# Patient Record
Sex: Female | Born: 1960 | Hispanic: No | Marital: Single | Smoking: Former smoker
Health system: Southern US, Community
[De-identification: ages and names within clinical notes are randomized; demographics above are authoritative.]

## PROBLEM LIST (undated history)

## (undated) DIAGNOSIS — Z720 Tobacco use: Secondary | ICD-10-CM

## (undated) DIAGNOSIS — M069 Rheumatoid arthritis, unspecified: Secondary | ICD-10-CM

## (undated) DIAGNOSIS — I729 Aneurysm of unspecified site: Secondary | ICD-10-CM

## (undated) DIAGNOSIS — E039 Hypothyroidism, unspecified: Secondary | ICD-10-CM

## (undated) DIAGNOSIS — J45909 Unspecified asthma, uncomplicated: Secondary | ICD-10-CM

## (undated) DIAGNOSIS — J449 Chronic obstructive pulmonary disease, unspecified: Secondary | ICD-10-CM

## (undated) HISTORY — DX: Chronic obstructive pulmonary disease, unspecified: J44.9

## (undated) HISTORY — PX: VAGINAL HYSTERECTOMY: SHX2639

## (undated) HISTORY — PX: KNEE ARTHROSCOPY: SHX127

## (undated) HISTORY — PX: GALLBLADDER SURGERY: SHX652

## (undated) HISTORY — DX: Unspecified asthma, uncomplicated: J45.909

## (undated) HISTORY — DX: Rheumatoid arthritis, unspecified: M06.9

## (undated) HISTORY — PX: CEREBRAL ANEURYSM REPAIR: SHX164

## (undated) HISTORY — DX: Tobacco use: Z72.0

## (undated) HISTORY — PX: CARPAL TUNNEL RELEASE: SHX101

## (undated) HISTORY — DX: Hypothyroidism, unspecified: E03.9

## (undated) HISTORY — PX: OTHER SURGICAL HISTORY: SHX169

## (undated) HISTORY — DX: Aneurysm of unspecified site: I72.9

---

## 2005-05-23 ENCOUNTER — Emergency Department: Payer: Self-pay | Admitting: Emergency Medicine
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
## 2019-04-21 ENCOUNTER — Ambulatory Visit: Payer: BC Managed Care – PPO | Admitting: Allergy and Immunology

## 2019-05-02 ENCOUNTER — Ambulatory Visit: Payer: BC Managed Care – PPO | Admitting: Allergy and Immunology

## 2019-05-04 ENCOUNTER — Ambulatory Visit (INDEPENDENT_AMBULATORY_CARE_PROVIDER_SITE_OTHER): Payer: BC Managed Care – PPO | Admitting: Allergy and Immunology

## 2019-05-04 ENCOUNTER — Other Ambulatory Visit: Payer: Self-pay

## 2019-05-04 ENCOUNTER — Encounter: Payer: Self-pay | Admitting: Allergy and Immunology

## 2019-05-04 VITALS — BP 118/74 | HR 78 | Temp 98.2°F | Resp 18 | Ht 63.8 in | Wt 208.8 lb

## 2019-05-04 DIAGNOSIS — H1010 Acute atopic conjunctivitis, unspecified eye: Secondary | ICD-10-CM

## 2019-05-04 DIAGNOSIS — J3089 Other allergic rhinitis: Secondary | ICD-10-CM | POA: Diagnosis not present

## 2019-05-04 DIAGNOSIS — T63441D Toxic effect of venom of bees, accidental (unintentional), subsequent encounter: Secondary | ICD-10-CM

## 2019-05-04 DIAGNOSIS — J4540 Moderate persistent asthma, uncomplicated: Secondary | ICD-10-CM | POA: Diagnosis not present

## 2019-05-04 DIAGNOSIS — J301 Allergic rhinitis due to pollen: Secondary | ICD-10-CM | POA: Diagnosis not present

## 2019-05-04 DIAGNOSIS — F17210 Nicotine dependence, cigarettes, uncomplicated: Secondary | ICD-10-CM

## 2019-05-04 DIAGNOSIS — K219 Gastro-esophageal reflux disease without esophagitis: Secondary | ICD-10-CM

## 2019-05-04 MED ORDER — MONTELUKAST SODIUM 10 MG PO TABS: 10.0000 mg | ORAL_TABLET | Freq: Every day | ORAL | 5 refills | Status: DC

## 2019-05-04 MED ORDER — TRELEGY ELLIPTA 100-62.5-25 MCG/INH IN AEPB: 1.0000 | INHALATION_SPRAY | Freq: Every day | RESPIRATORY_TRACT | 5 refills | Status: DC

## 2019-05-04 MED ORDER — OLOPATADINE HCL 0.2 % OP SOLN: OPHTHALMIC | 5 refills | Status: DC

## 2019-05-04 MED ORDER — FAMOTIDINE 40 MG PO TABS: 40.0000 mg | ORAL_TABLET | Freq: Every day | ORAL | 5 refills | Status: DC

## 2019-05-04 NOTE — Patient Instructions (Addendum)
  1.  Treat and prevent inflammation:   A. Trelegy - 1 inhalation daily  B. OTC Nasacort - 1 spray each nostril daily  C. Montelukast 10 mg - 1 tablet daily  2.  Treat and prevent reflux:   A.  Increase omeprazole to 40 mg in a.m.  B.  Start famotidine 40 mg in p.m.  C.  Consolidate all caffeine and chocolate use slowly  D.  Eliminate use of fish oil  E.  Raise head of bed  3.  If needed:   A.  Albuterol HFA - 2 inhalations or nebulization every 4-6 hours  B.  Cetirizine 10 mg - 1 tablet once a day  C.  Pataday - 1 drop each eye daily  D.  Epi-Pen  4.  Hold off on starting immunotherapy at this point.  5.  Review records from [facility] and rheumatologist   6.  Use nicotine replacements to replace tobacco smoke exposure  7.  Visit with rheumatologist concerning further management of rheumatoid arthritis.  8. Status of hepatitis C?  9.  Return to clinic in 3 weeks or earlier if problem

## 2019-05-04 NOTE — Progress Notes (Signed)
[place]   NEW PATIENT NOTE  Referring Provider: No ref. provider found Primary Provider: Marylen PontoHolt, Lynley S, MD Date of office visit: 05/04/2019     Subjective:   Chief Complaint:  [name] (DOB: 1961-08-10) is a 58 y.o. female who presents to the clinic on 05/04/2019 with a chief complaint of Asthma .  HPI: [name] presents to this clinic in evaluation of several issues.  First, she arrives today with a diagnosis of COPD and asthma.  Apparently she has a long smoking history since the age of 58 and currently smokes 1 pack about every 4 days while utilizing Chantix.  She has recurrent issues of intermittent "bronchitis" and required a systemic steroid 1 month ago for this issue and prior to that point time required a systemic steroid about 1 year ago.  She currently uses a short acting bronchodilator about 3 times per day for issues with "cannot get air in" without any coughing or wheezing.  She has this requirement for short acting bronchodilator while she continues to use Breo and Spiriva on a consistent basis.  Her Breo use has been of many years duration and she had Spiriva added last month.  Spiriva administration has resulted in irritation of her throat but no additional benefit regarding her lungs.  Second, she has very bad reflux.  She vomits 1 time per day and she has constant regurgitation and burning.  She also feels as though there is something in her throat and she tries to hang over the toilet bowl and clear out her throat by spitting out.  She drinks at least 20 ounces of espresso per day and eats chocolate on a daily basis.  Third, she has issues with nasal congestion and sneezing and itchy red watery eyes occurring on a perennial basis with flare during spring and fall usually precipitated by exposure to outdoors and cat and dog.  She has been receiving immunotherapy for 5 years but she had a hiatus of 1 year secondary to  lack of insurance and she restarted this form of treatment 1 month ago by [facility] in [place].  Fourth, she has a history of hymenoptera venom hypersensitivity state.  Apparently she was stung by some unidentified insect 5 years ago and developed shortness of breath and tongue swelling and she has been on immunotherapy for 5 years once again with a hiatus of 1 year recently for which she restarted this form of treatment 1 month ago at [facility] [place].  She is receiving immunotherapy for mixed vespid.  Fifth, she informs me that she has rheumatoid arthritis and she saw a rheumatologist in December 2019 and over the course of the past 2 months has been placed on Plaquenil and over the course of this past month has been placed on daily prednisone at 5 mg/day.  Sixth, she tells me that she is hepatitis C positive but she does not have active disease.  Past Medical History:  Diagnosis Date  . Aneurysm (HCC)   . Asthma   . COPD (chronic obstructive pulmonary disease) (HCC)   . Hypothyroidism   . Rheumatoid arthritis (HCC)   . Tobacco abuse     Past Surgical History:  Procedure Laterality Date  . CARPAL TUNNEL RELEASE Bilateral   . CEREBRAL ANEURYSM REPAIR      two clips  . GALLBLADDER SURGERY    . KNEE ARTHROSCOPY    . OTHER SURGICAL HISTORY  Bladder Tack X 2  . VAGINAL HYSTERECTOMY      Allergies as of 05/04/2019      Reactions   Imitrex [sumatriptan] Hives      Medication List      amitriptyline 50 MG tablet Commonly known as: ELAVIL Take one to two tablets at bedtime   azelastine 0.1 % nasal spray Commonly known as: ASTELIN Use one to two sprays in each nostril twice daily.   Breo Ellipta 100-25 MCG/INH Aepb Generic drug: fluticasone furoate-vilanterol Inhale 1 puff into the lungs daily.   CALCIUM CITRATE + D3 PO Take 2 tablets by mouth 2 (two) times a day.   cetirizine 10 MG tablet Commonly known as: ZYRTEC Take 10 mg by mouth  daily.   CHANTIX CONTINUING MONTH PAK PO Take by mouth.   EpiPen 2-Pak 0.3 mg/0.3 mL Soaj injection Generic drug: EPINEPHrine Inject 0.3 mg into the muscle as needed for anaphylaxis.   FISH OIL PO Take by mouth.   hydrochlorothiazide 25 MG tablet Commonly known as: HYDRODIURIL Take 25 mg by mouth daily.   hydroxychloroquine 200 MG tablet Commonly known as: PLAQUENIL Take 200 mg by mouth daily.   lamoTRIgine 100 MG tablet Commonly known as: LAMICTAL Take one and one-half tablets twice daily.   Latuda 60 MG Tabs Generic drug: Lurasidone HCl Take by mouth every morning.   levothyroxine 25 MCG tablet Commonly known as: SYNTHROID Take 25 mcg by mouth daily.   multivitamin tablet Take 1 tablet by mouth daily.   omeprazole 20 MG capsule Commonly known as: PRILOSEC Take 20 mg by mouth daily.   prazosin 1 MG capsule Commonly known as: MINIPRESS Take 2 mg by mouth at bedtime.   predniSONE 5 MG tablet Commonly known as: DELTASONE Take 5 mg by mouth daily.   propranolol 20 MG tablet Commonly known as: INDERAL Take 20 mg by mouth 2 (two) times daily.   Spiriva HandiHaler 18 MCG inhalation capsule Generic drug: tiotropium Place 18 mcg into inhaler and inhale daily.   Ventolin HFA 108 (90 Base) MCG/ACT inhaler Generic drug: albuterol Inhale 2 puffs into the lungs every 4 (four) hours as needed for wheezing or shortness of breath.   VITAMIN B-12 PO Take by mouth.       Review of systems negative except as noted in HPI / PMHx or noted below:  Review of Systems  Constitutional: Negative.   HENT: Negative.   Eyes: Negative.   Respiratory: Negative.   Cardiovascular: Negative.   Gastrointestinal: Negative.   Genitourinary: Negative.   Musculoskeletal: Negative.   Skin: Negative.   Neurological: Negative.   Endo/Heme/Allergies: Negative.   Psychiatric/Behavioral: Negative.     Family History  Problem Relation Age of Onset  . Diabetes Mother   . Rheum  arthritis Mother   . Cancer Father   . Osteoarthritis Maternal Aunt   . Cancer Maternal Grandfather     Social History   Socioeconomic History  . Marital status: Single    Spouse name: Not on file  . Number of children: Not on file  . Years of education: Not on file  . Highest education level: Not on file  Occupational History  . Not on file  Social Needs  . Financial resource strain: Not on file  . Food insecurity    Worry: Not on file    Inability: Not on file  . Transportation needs    Medical: Not on file    Non-medical: Not on file  Tobacco Use  .  Smoking status: Current Every Day Smoker  . Smokeless tobacco: Never Used  . Tobacco comment: started at age 58  Substance and Sexual Activity  . Alcohol use: Yes    Comment: Seldom  . Drug use: Never  . Sexual activity: Not on file  Lifestyle  . Physical activity    Days per week: Not on file    Minutes per session: Not on file  . Stress: Not on file  Relationships  . Social Musicianconnections    Talks on phone: Not on file    Gets together: Not on file    Attends religious service: Not on file    Active member of club or organization: Not on file    Attends meetings of clubs or organizations: Not on file    Relationship status: Not on file  . Intimate partner violence    Fear of current or ex partner: Not on file    Emotionally abused: Not on file    Physically abused: Not on file    Forced sexual activity: Not on file  Other Topics Concern  . Not on file  Social History Narrative  . Not on file    Environmental and Social history  Lives in a house with a dry environment, no animals located inside the household, no carpet in the bedroom, no plastic on the bed, no plastic on the pillow, actively smoking tobacco products. She works as a LawyerCNA.  Objective:   Vitals:   05/04/19 1411  BP: 118/74  Pulse: 78  Resp: 18  Temp: 98.2 F (36.8 C)  SpO2: 98%   Height: 5' 3.8" (162.1 cm) Weight: 208 lb 12.8 oz (94.7  kg)  Physical Exam Constitutional:      Appearance: She is not diaphoretic.     Comments: Raspy voice  HENT:     Head: Normocephalic.     Right Ear: Tympanic membrane, ear canal and external ear normal.     Left Ear: Tympanic membrane, ear canal and external ear normal.     Nose: Nose normal. No mucosal edema or rhinorrhea.     Mouth/Throat:     Pharynx: Uvula midline. No oropharyngeal exudate.  Eyes:     Conjunctiva/sclera: Conjunctivae normal.  Neck:     Thyroid: No thyromegaly.     Trachea: Trachea normal. No tracheal tenderness or tracheal deviation.  Cardiovascular:     Rate and Rhythm: Normal rate and regular rhythm.     Heart sounds: Normal heart sounds, S1 normal and S2 normal. No murmur.  Pulmonary:     Effort: No respiratory distress.     Breath sounds: Normal breath sounds. No stridor. No wheezing or rales.  Lymphadenopathy:     Head:     Right side of head: No tonsillar adenopathy.     Left side of head: No tonsillar adenopathy.     Cervical: No cervical adenopathy.  Skin:    Findings: No erythema or rash.     Nails: There is no clubbing.   Neurological:     Mental Status: She is alert.     Diagnostics: Allergy skin tests were not performed.   Spirometry was performed and demonstrated an FEV1 of 2.45 @ 94 % of predicted. FEV1/FVC = 0.80  Assessment and Plan:    1. Not well controlled moderate persistent asthma   2. Perennial allergic rhinitis   3. Seasonal allergic rhinitis due to pollen   4. Seasonal allergic conjunctivitis   5. LPRD (laryngopharyngeal reflux disease)  6. Light tobacco smoker <10 cigarettes per day   7. Systemic reaction to bee sting, accidental or unintentional, subsequent encounter     1.  Treat and prevent inflammation:   A. Trelegy - 1 inhalation daily  B. OTC Nasacort - 1 spray each nostril daily  C. Montelukast 10 mg - 1 tablet daily  2.  Treat and prevent reflux:   A.  Increase omeprazole to 40 mg in a.m.  B.  Start  famotidine 40 mg in p.m.  C.  Consolidate all caffeine and chocolate use slowly  D.  Eliminate use of fish oil  E.  Raise head of bed  3.  If needed:   A.  Albuterol HFA - 2 inhalations or nebulization every 4-6 hours  B.  Cetirizine 10 mg - 1 tablet once a day  C.  Pataday - 1 drop each eye daily  D.  Epi-Pen  4.  Hold off on starting immunotherapy at this point.  5.  Review records from [facility] and rheumatologist   6.  Use nicotine replacements to replace tobacco smoke exposure  7.  Visit with rheumatologist concerning further management of rheumatoid arthritis.  8.  Status of hepatitis C?  9.  Return to clinic in 3 weeks or earlier if problem  It does sound as though Veleta has atopic disease contributing to some of her respiratory tract symptoms but without doubt she also has a component of inadequately treated LPR and has a bunch of other issues that need attention including what appears to be rheumatoid arthritis presently treated with hydroxychloroquine and daily prednisone and apparent hepatitis C infection in the past.  We will have her utilize the collection of therapy noted above to address inflammation and reflux and I will be reviewing her medical records from [facility] and her rheumatologist to make a decision about starting her immunotherapy and further evaluation regarding her hepatitis C status.  I will see her back in this clinic in 3 weeks or earlier if there is a problem.  Allena Katz, MD Allergy / Immunology [place]

## 2019-05-05 ENCOUNTER — Encounter: Payer: Self-pay | Admitting: Allergy and Immunology

## 2019-05-11 ENCOUNTER — Telehealth: Payer: Self-pay | Admitting: Allergy and Immunology

## 2019-05-11 NOTE — Telephone Encounter (Signed)
Trelegy is requiring a PA. PA submitted via [employer] and it is currently suspended.

## 2019-05-11 NOTE — Telephone Encounter (Signed)
Rebecca Lee would like a prescription for Trelegy and Nasacort sent to [facility] on [address].

## 2019-05-16 NOTE — Telephone Encounter (Signed)
Working on PA

## 2019-05-16 NOTE — Telephone Encounter (Signed)
Looking at Ins her P[employer] is Rx benefits not [place] tracks will need to submit to correct plan. They have their PA form online

## 2019-05-20 NOTE — Telephone Encounter (Signed)
Received a fax from [employer] stating that medication is covered and does not require a PA. I sent this information to [facility].

## 2019-05-25 ENCOUNTER — Other Ambulatory Visit: Payer: Self-pay

## 2019-05-25 ENCOUNTER — Encounter: Payer: Self-pay | Admitting: Allergy and Immunology

## 2019-05-25 ENCOUNTER — Telehealth: Payer: Self-pay

## 2019-05-25 ENCOUNTER — Ambulatory Visit (INDEPENDENT_AMBULATORY_CARE_PROVIDER_SITE_OTHER): Payer: BC Managed Care – PPO | Admitting: Allergy and Immunology

## 2019-05-25 VITALS — BP 102/70 | HR 70 | Resp 18

## 2019-05-25 DIAGNOSIS — H1010 Acute atopic conjunctivitis, unspecified eye: Secondary | ICD-10-CM | POA: Diagnosis not present

## 2019-05-25 DIAGNOSIS — J4540 Moderate persistent asthma, uncomplicated: Secondary | ICD-10-CM

## 2019-05-25 DIAGNOSIS — F17210 Nicotine dependence, cigarettes, uncomplicated: Secondary | ICD-10-CM

## 2019-05-25 DIAGNOSIS — J3089 Other allergic rhinitis: Secondary | ICD-10-CM

## 2019-05-25 DIAGNOSIS — J301 Allergic rhinitis due to pollen: Secondary | ICD-10-CM

## 2019-05-25 DIAGNOSIS — K219 Gastro-esophageal reflux disease without esophagitis: Secondary | ICD-10-CM

## 2019-05-25 MED ORDER — PAZEO 0.7 % OP SOLN: 1.0000 [drp] | OPHTHALMIC | 1 refills | Status: DC

## 2019-05-25 NOTE — Patient Instructions (Addendum)
  1.  Continue to treat and prevent inflammation:   A. Trelegy - 1 inhalation daily  B. OTC Nasacort - 1 spray each nostril daily  C. Montelukast 10 mg - 1 tablet daily  2.  Continue to treat and prevent reflux:   A.  Omeprazole to 40 mg in a.m.  B.  Famotidine 40 mg in p.m.  C.  Consolidate all caffeine and chocolate use slowly  D.  Eliminate use of fish oil  E.  Raise head of bed  3.  If needed:   A.  Albuterol HFA - 2 inhalations or nebulization every 4-6 hours  B.  Cetirizine 10 mg - 1 tablet once a day  C.  Pataday - 1 drop each eye daily  D.  Epi-Pen  4. Use nicotine replacements to replace tobacco smoke exposure  5. Return to clinic in 9 weeks or earlier if problem  6. Plan for fall flu vaccine (and COVID vaccine)  7. Restart Mixed Vespid immunotherapy

## 2019-05-25 NOTE — Progress Notes (Signed)
[place]   Follow-up Note  Referring Provider: Marylen PontoHolt, Lynley S, MD Primary Provider: Marylen PontoHolt, Lynley S, MD Date of Office Visit: 05/25/2019  Subjective:   [name] (DOB: 01-11-1961) is a 58 y.o. female who [facility] in re-evaluation of the following:  HPI: Rebecca LagerYolanda returns to this clinic in evaluation of a multitude of issues addressed during her initial evaluation of 04 May 2019 including asthma, allergic rhinoconjunctivitis, recurrent emesis in the context of LPR, and tobacco use and a history of hymenoptera venom hypersensitivity state.Marland Kitchen.  Her breathing is going great.  She has very little issues with wheezing or coughing and has no need to use a short acting bronchodilator other than the fact that when she is at work wearing a mask she feels very constricted with her breathing and will use a short acting bronchodilator.  When she is at home she does not use a short acting bronchodilator.  She continues to smoke on a consistent basis even in the face of utilizing Chantix.  She continues on Trelegy.  Apparently she had an episode of thrush that required administration of a nystatin oral solution for about 5 days which cleared this issue.  Her nose has not been causing her problem at all at this point.  She continues on a nasal steroid and montelukast.  Her reflux is much better.  She no longer vomits on a daily basis and no longer has regurgitation and burning.  She has eliminated all chocolate consumption and has started to consolidate her caffeine use. She does not drink any past 3:00 in the afternoon.  She eliminated the use of her fish oil.  She has been consistently using a proton pump inhibitor and H2 receptor blocker.  She has not restarted her mixed vespid immunotherapy administered from [facility] as of yet.  Her last dose was 18 April 2019.  She does have a history of active  rheumatoid arthritis treated with Plaquenil and low-dose prednisone and has an appointment to see her rheumatologist on 21 June 2019.  Allergies as of 05/25/2019      Reactions   Imitrex [sumatriptan] Hives      Medication List    amitriptyline 50 MG tablet Commonly known as: ELAVIL Take one to two tablets at bedtime   azelastine 0.1 % nasal spray Commonly known as: ASTELIN Use one to two sprays in each nostril twice daily.   CALCIUM CITRATE + D3 PO Take 2 tablets by mouth 2 (two) times a day.   cetirizine 10 MG tablet Commonly known as: ZYRTEC Take 10 mg by mouth daily.   CHANTIX CONTINUING MONTH PAK PO Take by mouth.   EpiPen 2-Pak 0.3 mg/0.3 mL Soaj injection Generic drug: EPINEPHrine Inject 0.3 mg into the muscle as needed for anaphylaxis.   famotidine 40 MG tablet Commonly known as: PEPCID Take 1 tablet (40 mg total) by mouth at bedtime.   FISH OIL PO Take by mouth.   hydrochlorothiazide 25 MG tablet Commonly known as: HYDRODIURIL Take 25 mg by mouth daily.   hydroxychloroquine 200 MG tablet Commonly known as: PLAQUENIL Take 200 mg by mouth daily.   lamoTRIgine 100 MG tablet Commonly known as: LAMICTAL Take one and one-half tablets twice daily.   Latuda 60 MG Tabs Generic drug: Lurasidone HCl Take by mouth every morning.   levothyroxine 25 MCG tablet Commonly known as: SYNTHROID Take 25 mcg by mouth daily.  montelukast 10 MG tablet Commonly known as: SINGULAIR Take 1 tablet (10 mg total) by mouth at bedtime.   multivitamin tablet Take 1 tablet by mouth daily.   Olopatadine HCl 0.2 % Soln Commonly known as: Pataday Can use one drop in each eye once daily if needed for red, itchy, watery eyes.   omeprazole 20 MG capsule Commonly known as: PRILOSEC Take 20 mg by mouth daily.   prazosin 1 MG capsule Commonly known as: MINIPRESS Take 2 mg by mouth at bedtime.   predniSONE 5 MG tablet Commonly known as: DELTASONE Take 5 mg by mouth  daily.   propranolol 20 MG tablet Commonly known as: INDERAL Take 20 mg by mouth 2 (two) times daily.   Trelegy Ellipta 100-62.5-25 MCG/INH Aepb Generic drug: Fluticasone-Umeclidin-Vilant Inhale 1 Dose into the lungs daily. Rinse, gargle, and spit after use.   Ventolin HFA 108 (90 Base) MCG/ACT inhaler Generic drug: albuterol Inhale 2 puffs into the lungs every 4 (four) hours as needed for wheezing or shortness of breath.   VITAMIN B-12 PO Take by mouth.       Past Medical History:  Diagnosis Date  . Aneurysm (HCC)   . Asthma   . COPD (chronic obstructive pulmonary disease) (HCC)   . Hypothyroidism   . Rheumatoid arthritis (HCC)   . Tobacco abuse     Past Surgical History:  Procedure Laterality Date  . CARPAL TUNNEL RELEASE Bilateral   . CEREBRAL ANEURYSM REPAIR      two clips  . GALLBLADDER SURGERY    . KNEE ARTHROSCOPY    . OTHER SURGICAL HISTORY     Bladder Tack X 2  . VAGINAL HYSTERECTOMY      Review of systems negative except as noted in HPI / PMHx or noted below:  Review of Systems  Constitutional: Negative.   HENT: Negative.   Eyes: Negative.   Respiratory: Negative.   Cardiovascular: Negative.   Gastrointestinal: Negative.   Genitourinary: Negative.   Musculoskeletal: Negative.   Skin: Negative.   Neurological: Negative.   Endo/Heme/Allergies: Negative.   Psychiatric/Behavioral: Negative.      Objective:   Vitals:   05/25/19 1107  BP: 102/70  Pulse: 70  Resp: 18  SpO2: 98%          Physical Exam Constitutional:      Appearance: She is not diaphoretic.  HENT:     Head: Normocephalic.     Right Ear: Tympanic membrane, ear canal and external ear normal.     Left Ear: Tympanic membrane, ear canal and external ear normal.     Nose: Nose normal. No mucosal edema or rhinorrhea.     Mouth/Throat:     Pharynx: Uvula midline. No oropharyngeal exudate.  Eyes:     Conjunctiva/sclera: Conjunctivae normal.  Neck:     Thyroid: No  thyromegaly.     Trachea: Trachea normal. No tracheal tenderness or tracheal deviation.  Cardiovascular:     Rate and Rhythm: Normal rate and regular rhythm.     Heart sounds: Normal heart sounds, S1 normal and S2 normal. No murmur.  Pulmonary:     Effort: No respiratory distress.     Breath sounds: Normal breath sounds. No stridor. No wheezing or rales.  Lymphadenopathy:     Head:     Right side of head: No tonsillar adenopathy.     Left side of head: No tonsillar adenopathy.     Cervical: No cervical adenopathy.  Skin:    Findings: No erythema  or rash.     Nails: There is no clubbing.   Neurological:     Mental Status: She is alert.     Diagnostics:    Spirometry was performed and demonstrated an FEV1 of 2.26 at 86 % of predicted.  Assessment and Plan:   1. Asthma, moderate persistent, well-controlled   2. Perennial allergic rhinitis   3. Seasonal allergic rhinitis due to pollen   4. Seasonal allergic conjunctivitis   5. LPRD (laryngopharyngeal reflux disease)   6. Light tobacco smoker <10 cigarettes per day     1.  Continue to treat and prevent inflammation:   A. Trelegy - 1 inhalation daily  B. OTC Nasacort - 1 spray each nostril daily  C. Montelukast 10 mg - 1 tablet daily  2.  Continue to treat and prevent reflux:   A.  Omeprazole to 40 mg in a.m.  B.  Famotidine 40 mg in p.m.  C.  Consolidate all caffeine and chocolate use slowly  D.  Eliminate use of fish oil  E.  Raise head of bed  3.  If needed:   A.  Albuterol HFA - 2 inhalations or nebulization every 4-6 hours  B.  Cetirizine 10 mg - 1 tablet once a day  C.  Pataday - 1 drop each eye daily  D.  Epi-Pen  4. Use nicotine replacements to replace tobacco smoke exposure  5. Return to clinic in 9 weeks or earlier if problem  6. Plan for fall flu vaccine (and COVID vaccine)  7. Restart Mixed Vespid immunotherapy  Joylyn appears to be doing relatively well on her current therapy which includes  anti-inflammatory medications for her airway and therapy directed against reflux.  Of course, she is to stop smoking and I did talk with her today about the need to find a different hobby for her relaxation and stress reduction.  She can use oral nicotine if required.  We will be starting her on mixed vespid immunotherapy at this point in time.  I will see her back in this clinic in 9 weeks which will be a total of 12 weeks of treatment and will make a determination about possibly consolidating some of her treatment if she is doing well.  Allena Katz, MD Allergy / Immunology [place]

## 2019-05-25 NOTE — Telephone Encounter (Signed)
P[place] name brand sent in since Pataday not covered by insurance.

## 2019-05-26 ENCOUNTER — Encounter: Payer: Self-pay | Admitting: Allergy and Immunology

## 2019-05-31 ENCOUNTER — Other Ambulatory Visit: Payer: Self-pay | Admitting: *Deleted

## 2019-05-31 MED ORDER — OMEPRAZOLE 40 MG PO CPDR: DELAYED_RELEASE_CAPSULE | ORAL | 5 refills | Status: DC

## 2019-06-08 ENCOUNTER — Ambulatory Visit: Payer: Self-pay | Admitting: Adult Health

## 2019-06-14 ENCOUNTER — Ambulatory Visit: Payer: BC Managed Care – PPO | Admitting: Adult Health

## 2019-06-16 ENCOUNTER — Ambulatory Visit (INDEPENDENT_AMBULATORY_CARE_PROVIDER_SITE_OTHER): Payer: BC Managed Care – PPO | Admitting: *Deleted

## 2019-06-16 ENCOUNTER — Telehealth: Payer: Self-pay | Admitting: Allergy and Immunology

## 2019-06-16 DIAGNOSIS — Z91038 Other insect allergy status: Secondary | ICD-10-CM | POA: Diagnosis not present

## 2019-06-16 NOTE — Telephone Encounter (Signed)
Called and talked with patient.  Her PCP gave her Nystatin and she has been using this four times daily for about four weeks.  I informed Dr.Kozlow and he wants to see patient on Monday to address issue.  Patient agreed with plan and I scheduled her for 8:30 on Monday, August 24th.

## 2019-06-16 NOTE — Telephone Encounter (Signed)
Rebecca Lee called in and states that when she uses Trelegy, even after she gargles and spits, she keeps getting thrush.  Rebecca Lee states her PCP gave her something for thrush but it came back.  Rebecca Lee would like to know if something else can be called in to control the thrush.  Please advise.

## 2019-06-16 NOTE — Telephone Encounter (Signed)
She can use Nystatin swish and swallow 5 mls EVERY day, 1 time a day to try and prevent thrush.

## 2019-06-16 NOTE — Progress Notes (Signed)
Rebecca Lee will resume her venom immunotherapy against Mixed Vespid here. She previously received her injections at [facility]. She received 0.05 cc of 0.30 mcg Mixed Vespid. She will build up weekly. Has Epipen.

## 2019-06-17 ENCOUNTER — Encounter: Payer: Self-pay | Admitting: *Deleted

## 2019-06-20 ENCOUNTER — Other Ambulatory Visit: Payer: Self-pay

## 2019-06-20 ENCOUNTER — Encounter: Payer: Self-pay | Admitting: Allergy and Immunology

## 2019-06-20 ENCOUNTER — Ambulatory Visit (INDEPENDENT_AMBULATORY_CARE_PROVIDER_SITE_OTHER): Payer: BC Managed Care – PPO | Admitting: Allergy and Immunology

## 2019-06-20 VITALS — BP 108/68 | HR 76 | Temp 98.1°F | Resp 16

## 2019-06-20 DIAGNOSIS — J3089 Other allergic rhinitis: Secondary | ICD-10-CM

## 2019-06-20 DIAGNOSIS — B370 Candidal stomatitis: Secondary | ICD-10-CM | POA: Diagnosis not present

## 2019-06-20 DIAGNOSIS — J4540 Moderate persistent asthma, uncomplicated: Secondary | ICD-10-CM | POA: Diagnosis not present

## 2019-06-20 DIAGNOSIS — J301 Allergic rhinitis due to pollen: Secondary | ICD-10-CM

## 2019-06-20 DIAGNOSIS — K219 Gastro-esophageal reflux disease without esophagitis: Secondary | ICD-10-CM

## 2019-06-20 DIAGNOSIS — F17210 Nicotine dependence, cigarettes, uncomplicated: Secondary | ICD-10-CM | POA: Diagnosis not present

## 2019-06-20 DIAGNOSIS — H1010 Acute atopic conjunctivitis, unspecified eye: Secondary | ICD-10-CM

## 2019-06-20 DIAGNOSIS — Z91038 Other insect allergy status: Secondary | ICD-10-CM

## 2019-06-20 MED ORDER — FLUCONAZOLE 150 MG PO TABS: ORAL_TABLET | ORAL | 0 refills | Status: DC

## 2019-06-20 NOTE — Patient Instructions (Addendum)
  1.  Continue to treat and prevent inflammation:   A. Trelegy - 1 inhalation daily  B. OTC Nasacort - 1 spray each nostril daily  C. Montelukast 10 mg - 1 tablet daily  2.  Continue to treat and prevent reflux:   A.  Omeprazole to 40 mg in a.m.  B.  Famotidine 40 mg in p.m.  C.  Consolidate all caffeine and chocolate use slowly  D.  Eliminate use of fish oil  E.  Raise head of bed  3. Treat thrush:   A. Diflucan 150 - 1 tablet today and repeat every WEEK for 4 weeks  B. Nystatin oral solution - 5 mls swish and swallow after Trelegy use.   4.  If needed:   A.  Albuterol HFA - 2 inhalations or nebulization every 4-6 hours  B.  Cetirizine 10 mg - 1 tablet once a day  C.  Pataday - 1 drop each eye daily  D.  Epi-Pen  5. Use nicotine replacements to replace tobacco smoke exposure  6. Return to clinic for previously arranged appointment  7. Plan for fall flu vaccine (and COVID vaccine)  8. Continue Mixed Vespid immunotherapy

## 2019-06-20 NOTE — Progress Notes (Signed)
[place] - [place]   Follow-up Note  Referring Provider: Marylen PontoHolt, Lynley S, MD Primary Provider: Marylen PontoHolt, Lynley S, MD Date of Office Visit: 06/20/2019  Subjective:   Rebecca Lee (DOB: 10/29/1960) is a 58 y.o. female [facility] in re-evaluation of the following:  HPI: Rebecca Lee returns to this clinic in reevaluation of her asthma and allergic rhinoconjunctivitis and history of reflux with LPR and recurrent emesis and tobacco use and history of hymenoptera venom hypersensitivity state.  I last saw her in this clinic on 25 May 2019 at which time she was doing relatively well.  However, she has problems with recurrent thrush.  She has been treated multiple times by her primary care doctor for thrush.  She has been on nystatin for about 4 weeks at this point in time and she is 90% better regarding her most recent issue with thrush but she still has some persistent areas on her tongue and her oral cavity.  All of her other issues involving her respiratory tract whether they be from inflammation or reflux are under excellent control on her current therapy.  She did restart mixed venom immunotherapy.  She is down to smoking about 4 cigarettes/day.  Allergies as of 06/20/2019      Reactions   Imitrex [sumatriptan] Hives      Medication List    amitriptyline 50 MG tablet Commonly known as: ELAVIL Take one to two tablets at bedtime   benztropine 0.5 MG tablet Commonly known as: COGENTIN TAKE 1 TABLET BY MOUTH TWICE A DAY AS NEEDED FOR ANXIETY, RESTLESNESS, OR MOVEMENT   CALCIUM CITRATE + D3 PO Take 2 tablets by mouth 2 (two) times a day.   cetirizine 10 MG tablet Commonly known as: ZYRTEC Take 10 mg by mouth daily.   EpiPen 2-Pak 0.3 mg/0.3 mL Soaj injection Generic drug: EPINEPHrine Inject 0.3 mg into the muscle as needed for anaphylaxis.   escitalopram 10 MG tablet Commonly known as: LEXAPRO   famotidine 40 MG tablet Commonly known as: PEPCID Take 1 tablet (40 mg total) by mouth at bedtime.   hydrochlorothiazide 12.5 MG capsule Commonly known as: MICROZIDE   hydroxychloroquine 200 MG tablet Commonly known as: PLAQUENIL Take 200 mg by mouth daily.   lamoTRIgine 100 MG tablet Commonly known as: LAMICTAL Take one and one-half tablets twice daily.   Latuda 80 MG Tabs tablet Generic drug: lurasidone TAKE 1 TABLET BY MOUTH EVERY DAY IN THE MORNING FPR MOOD STABILIZATION   levothyroxine 25 MCG tablet Commonly known as: SYNTHROID Take 25 mcg by mouth daily.   meloxicam 15 MG tablet Commonly known as: MOBIC   montelukast 10 MG tablet Commonly known as: SINGULAIR Take 1 tablet (10 mg total) by mouth at bedtime.   multivitamin tablet Take 1 tablet by mouth daily.   nystatin 100000 UNIT/ML suspension Commonly known as: MYCOSTATIN SWISH AND SWALLOW 5ML BY MOUTH 4 TIMES A DY FOR 5 DAYS   omeprazole 40 MG capsule Commonly known as: PRILOSEC Take one capsule every morning   Pazeo 0.7 % Soln Generic drug: Olopatadine HCl Place 1 drop into both eyes 1 day or 1 dose.   prazosin 1 MG capsule Commonly known as: MINIPRESS Take 2 mg by mouth at bedtime.   predniSONE 5 MG tablet Commonly known as: DELTASONE Take 5 mg by mouth daily.   propranolol 20 MG tablet Commonly known as: INDERAL Take 20 mg by mouth 2 (  two) times daily.   Trelegy Ellipta 100-62.5-25 MCG/INH Aepb Generic drug: Fluticasone-Umeclidin-Vilant Inhale 1 Dose into the lungs daily. Rinse, gargle, and spit after use.   Ubrelvy 50 MG Tabs Generic drug: Ubrogepant   Ventolin HFA 108 (90 Base) MCG/ACT inhaler Generic drug: albuterol Inhale 2 puffs into the lungs every 4 (four) hours as needed for wheezing or shortness of breath.   VITAMIN B-12 PO Take by mouth.       Past Medical History:  Diagnosis Date  . Aneurysm ([place])   . Asthma   . COPD (chronic obstructive pulmonary disease) ([place])    . Hypothyroidism   . Rheumatoid arthritis ([place])   . Tobacco abuse     Past Surgical History:  Procedure Laterality Date  . CARPAL TUNNEL RELEASE Bilateral   . CEREBRAL ANEURYSM REPAIR      two clips  . GALLBLADDER SURGERY    . KNEE ARTHROSCOPY    . OTHER SURGICAL HISTORY     Bladder Tack X 2  . VAGINAL HYSTERECTOMY      Review of systems negative except as noted in HPI / PMHx or noted below:  Review of Systems  Constitutional: Negative.   HENT: Negative.   Eyes: Negative.   Respiratory: Negative.   Cardiovascular: Negative.   Gastrointestinal: Negative.   Genitourinary: Negative.   Musculoskeletal: Negative.   Skin: Negative.   Neurological: Negative.   Endo/Heme/Allergies: Negative.   Psychiatric/Behavioral: Negative.      Objective:   Vitals:   06/20/19 0850  BP: 108/68  Pulse: 76  Resp: 16  Temp: 98.1 F (36.7 C)  SpO2: 97%          Physical Exam Constitutional:      Appearance: She is not diaphoretic.  HENT:     Head: Normocephalic.     Right Ear: Tympanic membrane, ear canal and external ear normal.     Left Ear: Tympanic membrane, ear canal and external ear normal.     Nose: Nose normal. No mucosal edema or rhinorrhea.     Mouth/Throat:     Pharynx: Uvula midline. Oropharyngeal exudate (Thrush on tongue and gums.) present.  Eyes:     Conjunctiva/sclera: Conjunctivae normal.  Neck:     Thyroid: No thyromegaly.     Trachea: Trachea normal. No tracheal tenderness or tracheal deviation.  Cardiovascular:     Rate and Rhythm: Normal rate and regular rhythm.     Heart sounds: Normal heart sounds, S1 normal and S2 normal. No murmur.  Pulmonary:     Effort: No respiratory distress.     Breath sounds: Normal breath sounds. No stridor. No wheezing or rales.  Lymphadenopathy:     Head:     Right side of head: No tonsillar adenopathy.     Left side of head: No tonsillar adenopathy.     Cervical: No cervical adenopathy.  Skin:    Findings: No  erythema or rash.     Nails: There is no clubbing.   Neurological:     Mental Status: She is alert.     Diagnostics:    Spirometry was performed and demonstrated an FEV1 of 1.59 at 61 % of predicted.    Assessment and Plan:   1. Thrush   2. Asthma, moderate persistent, well-controlled   3. Light tobacco smoker <10 cigarettes per day   4. Perennial allergic rhinitis   5. Seasonal allergic rhinitis due to pollen   6. Seasonal allergic conjunctivitis   7. 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 LPRD (laryngopharyngeal reflux disease)     1.  Continue to treat and prevent inflammation:   A. Trelegy - 1 inhalation daily  B. OTC Nasacort - 1 spray each nostril daily  C. Montelukast 10 mg - 1 tablet daily  2.  Continue to treat and prevent reflux:   A.  Omeprazole to 40 mg in a.m.  B.  Famotidine 40 mg in p.m.  C.  Consolidate all caffeine and chocolate use slowly  D.  Eliminate use of fish oil  E.  Raise head of bed  3. Treat thrush:   A. Diflucan 150 - 1 tablet today and repeat every WEEK for 4 weeks  B. Nystatin oral solution - 5 mls swish and swallow after Trelegy use.   4.  If needed:   A.  Albuterol HFA - 2 inhalations or nebulization every 4-6 hours  B.  Cetirizine 10 mg - 1 tablet once a day  C.  Pataday - 1 drop each eye daily  D.  Epi-Pen  5. Use nicotine replacements to replace tobacco smoke exposure  6. Return to clinic for previously arranged appointment  7. Plan for fall flu vaccine (and COVID vaccine)  8. Continue Mixed Vespid immunotherapy   Rebecca Lee appears to have a persistent issue with thrush and we will now treat her with Diflucan utilized 1 time per week for the next 4 weeks and have her remain on a daily dose of nystatin after her inhaler use.  She will maintain therapy for inflammation and reflux as noted above.  I will see her back in this clinic for her previously arranged appointments.  Laurette SchimkeEric , MD Allergy / Immunology [facility]

## 2019-06-21 ENCOUNTER — Encounter: Payer: Self-pay | Admitting: Allergy and Immunology

## 2019-06-23 ENCOUNTER — Ambulatory Visit (INDEPENDENT_AMBULATORY_CARE_PROVIDER_SITE_OTHER): Payer: BC Managed Care – PPO | Admitting: *Deleted

## 2019-06-23 DIAGNOSIS — Z91038 Other insect allergy status: Secondary | ICD-10-CM | POA: Diagnosis not present

## 2019-07-07 ENCOUNTER — Ambulatory Visit (INDEPENDENT_AMBULATORY_CARE_PROVIDER_SITE_OTHER): Payer: BC Managed Care – PPO | Admitting: *Deleted

## 2019-07-07 DIAGNOSIS — Z91038 Other insect allergy status: Secondary | ICD-10-CM | POA: Diagnosis not present

## 2019-07-21 ENCOUNTER — Ambulatory Visit (INDEPENDENT_AMBULATORY_CARE_PROVIDER_SITE_OTHER): Payer: BC Managed Care – PPO | Admitting: *Deleted

## 2019-07-21 DIAGNOSIS — Z91038 Other insect allergy status: Secondary | ICD-10-CM

## 2019-07-24 ENCOUNTER — Other Ambulatory Visit: Payer: Self-pay | Admitting: Allergy and Immunology

## 2019-07-27 ENCOUNTER — Ambulatory Visit: Payer: BC Managed Care – PPO | Admitting: Allergy and Immunology

## 2019-07-28 ENCOUNTER — Ambulatory Visit (INDEPENDENT_AMBULATORY_CARE_PROVIDER_SITE_OTHER): Payer: Medicaid Other | Admitting: *Deleted

## 2019-07-28 DIAGNOSIS — Z91038 Other insect allergy status: Secondary | ICD-10-CM

## 2019-08-15 ENCOUNTER — Ambulatory Visit (INDEPENDENT_AMBULATORY_CARE_PROVIDER_SITE_OTHER): Payer: Medicaid Other | Admitting: *Deleted

## 2019-08-15 DIAGNOSIS — Z91038 Other insect allergy status: Secondary | ICD-10-CM | POA: Diagnosis not present

## 2019-08-22 ENCOUNTER — Ambulatory Visit (INDEPENDENT_AMBULATORY_CARE_PROVIDER_SITE_OTHER): Payer: Medicaid Other | Admitting: *Deleted

## 2019-08-22 DIAGNOSIS — Z91038 Other insect allergy status: Secondary | ICD-10-CM

## 2019-09-05 ENCOUNTER — Ambulatory Visit (INDEPENDENT_AMBULATORY_CARE_PROVIDER_SITE_OTHER): Payer: Medicaid Other | Admitting: *Deleted

## 2019-09-05 DIAGNOSIS — Z91038 Other insect allergy status: Secondary | ICD-10-CM | POA: Diagnosis not present

## 2019-09-12 ENCOUNTER — Telehealth: Payer: Self-pay | Admitting: *Deleted

## 2019-09-12 NOTE — Telephone Encounter (Signed)
PA for Trelegy has been submitted via [employer] and is currently Suspended/ Pending. Confirmation number is [id number]

## 2019-09-13 ENCOUNTER — Other Ambulatory Visit: Payer: Self-pay

## 2019-09-13 MED ORDER — BREO ELLIPTA 200-25 MCG/INH IN AEPB: 1.0000 | INHALATION_SPRAY | Freq: Every day | RESPIRATORY_TRACT | 5 refills | Status: DC

## 2019-09-13 MED ORDER — INCRUSE ELLIPTA 62.5 MCG/INH IN AEPB: 1.0000 | INHALATION_SPRAY | Freq: Every day | RESPIRATORY_TRACT | 5 refills | Status: DC

## 2019-09-13 NOTE — Addendum Note (Signed)
Addended by: Valere Dross on: 09/13/2019 01:55 PM   Modules accepted: Orders

## 2019-09-13 NOTE — Progress Notes (Signed)
Duplicated encounter.

## 2019-09-13 NOTE — Progress Notes (Unsigned)
PA for Breo initiated and awaiting for approval through [email or web address]

## 2019-09-13 NOTE — Telephone Encounter (Signed)
Lets try a combination of Breo 201 inhalation 1 time per day + Incruse 1 inhalation 1 time per day

## 2019-09-13 NOTE — Telephone Encounter (Signed)
Breo and Incruse was sent to the pharmacy

## 2019-09-13 NOTE — Telephone Encounter (Signed)
PA for Trelegy was denied will need alternative medication

## 2019-09-19 ENCOUNTER — Ambulatory Visit (INDEPENDENT_AMBULATORY_CARE_PROVIDER_SITE_OTHER): Payer: Medicaid Other | Admitting: *Deleted

## 2019-09-19 DIAGNOSIS — Z91038 Other insect allergy status: Secondary | ICD-10-CM

## 2019-10-10 ENCOUNTER — Ambulatory Visit (INDEPENDENT_AMBULATORY_CARE_PROVIDER_SITE_OTHER): Payer: Medicaid Other

## 2019-10-10 DIAGNOSIS — Z91038 Other insect allergy status: Secondary | ICD-10-CM | POA: Diagnosis not present

## 2019-10-17 ENCOUNTER — Ambulatory Visit (INDEPENDENT_AMBULATORY_CARE_PROVIDER_SITE_OTHER): Payer: Medicaid Other | Admitting: Allergy and Immunology

## 2019-10-17 ENCOUNTER — Encounter: Payer: Self-pay | Admitting: Allergy and Immunology

## 2019-10-17 ENCOUNTER — Other Ambulatory Visit: Payer: Self-pay

## 2019-10-17 VITALS — BP 140/84 | HR 76 | Resp 16

## 2019-10-17 DIAGNOSIS — Z91038 Other insect allergy status: Secondary | ICD-10-CM | POA: Diagnosis not present

## 2019-10-17 DIAGNOSIS — J3089 Other allergic rhinitis: Secondary | ICD-10-CM | POA: Diagnosis not present

## 2019-10-17 DIAGNOSIS — J4540 Moderate persistent asthma, uncomplicated: Secondary | ICD-10-CM

## 2019-10-17 DIAGNOSIS — K219 Gastro-esophageal reflux disease without esophagitis: Secondary | ICD-10-CM | POA: Diagnosis not present

## 2019-10-17 DIAGNOSIS — B370 Candidal stomatitis: Secondary | ICD-10-CM | POA: Diagnosis not present

## 2019-10-17 DIAGNOSIS — F17210 Nicotine dependence, cigarettes, uncomplicated: Secondary | ICD-10-CM

## 2019-10-17 NOTE — Progress Notes (Signed)
[place] - [place] - [place] - [place] - [place]   Follow-up Note  Referring Provider: Marylen Ponto, MD Primary Provider: Marylen Ponto, MD Date of Office Visit: 10/17/2019  Subjective:   Rebecca Lee (DOB: 1961-04-04) is a 58 y.o. female [facility] in re-evaluation of the following:  HPI: Mallarie returns to this clinic in evaluation of asthma and allergic rhinoconjunctivitis and LPR and history of recurrent emesis and tobacco use and history of hymenoptera venom hypersensitivity state.  Her last visit to this clinic was 20 June 2019.  As long as she continues to use medications directed against respiratory tract inflammation she does wonderful.  She has had very little issues with her asthma and rarely uses a short acting bronchodilators had very little issues with her nose.  Unfortunately, because of some logistical issue involving her pharmacy she has not received her Trelegy and she has not received her nasal steroid for the past 2 weeks and she has developed issues with coughing and wheezing and must use a bronchodilator.  She continues to smoke about 1 cigarette/day.  Her reflux is under excellent control at this point in time on her current plan.  She did have an issue with recurrent thrush which is under excellent control with the use of a nystatin oral solution after using Trelegy.  She continues on immunotherapy directed against hymenoptera venom without any adverse effect.  She did receive the flu vaccine this year.  Allergies as of 10/17/2019      Reactions   Imitrex [sumatriptan] Hives      Medication List      amitriptyline 50 MG tablet Commonly known as: ELAVIL Take one to two tablets at bedtime   benztropine 0.5 MG tablet Commonly known as: COGENTIN TAKE 1 TABLET BY MOUTH TWICE A DAY AS NEEDED FOR ANXIETY, RESTLESNESS, OR MOVEMENT   Breo Ellipta 200-25 MCG/INH Aepb Generic drug:  fluticasone furoate-vilanterol Inhale 1 puff into the lungs daily.   CALCIUM CITRATE + D3 PO Take 2 tablets by mouth 2 (two) times a day.   cetirizine 10 MG tablet Commonly known as: ZYRTEC Take 10 mg by mouth daily.   EpiPen 2-Pak 0.3 mg/0.3 mL Soaj injection Generic drug: EPINEPHrine Inject 0.3 mg into the muscle as needed for anaphylaxis.   escitalopram 10 MG tablet Commonly known as: LEXAPRO   famotidine 40 MG tablet Commonly known as: PEPCID Take 1 tablet (40 mg total) by mouth at bedtime.   fluconazole 150 MG tablet Commonly known as: DIFLUCAN Take one tablet once a week for four weeks.   hydrochlorothiazide 12.5 MG capsule Commonly known as: MICROZIDE   hydroxychloroquine 200 MG tablet Commonly known as: PLAQUENIL Take 200 mg by mouth daily.   Incruse Ellipta 62.5 MCG/INH Aepb Generic drug: umeclidinium bromide Inhale 1 puff into the lungs daily.   lamoTRIgine 100 MG tablet Commonly known as: LAMICTAL Take one and one-half tablets twice daily.   Latuda 80 MG Tabs tablet Generic drug: lurasidone TAKE 1 TABLET BY MOUTH EVERY DAY IN THE MORNING FPR MOOD STABILIZATION   levothyroxine 25 MCG tablet Commonly known as: SYNTHROID Take 25 mcg by mouth daily.   meloxicam 15 MG tablet Commonly known as: MOBIC   montelukast 10 MG tablet Commonly known as: SINGULAIR Take 1 tablet (10 mg total) by mouth at bedtime.   multivitamin tablet Take 1 tablet by mouth daily.   nystatin 100000 UNIT/ML suspension Commonly known as: MYCOSTATIN SWISH  AND SWALLOW 5ML BY MOUTH 4 TIMES A DY FOR 5 DAYS   omeprazole 40 MG capsule Commonly known as: PRILOSEC Take one capsule every morning   Pazeo 0.7 % Soln Generic drug: Olopatadine HCl PLACE 1 DROP INTO BOTH EYES ONCE A DAY   prazosin 1 MG capsule Commonly known as: MINIPRESS Take 2 mg by mouth at bedtime.   predniSONE 5 MG tablet Commonly known as: DELTASONE Take 5 mg by mouth daily.   propranolol 20 MG  tablet Commonly known as: INDERAL Take 20 mg by mouth 2 (two) times daily.   Ubrelvy 50 MG Tabs Generic drug: Ubrogepant   Ventolin HFA 108 (90 Base) MCG/ACT inhaler Generic drug: albuterol Inhale 2 puffs into the lungs every 4 (four) hours as needed for wheezing or shortness of breath.   VITAMIN B-12 PO Take by mouth.       Past Medical History:  Diagnosis Date  . Aneurysm ([facility])   . Asthma   . COPD (chronic obstructive pulmonary disease) ([facility])   . Hypothyroidism   . Rheumatoid arthritis ([facility])   . Tobacco abuse     Past Surgical History:  Procedure Laterality Date  . CARPAL TUNNEL RELEASE Bilateral   . CEREBRAL ANEURYSM REPAIR      two clips  . GALLBLADDER SURGERY    . KNEE ARTHROSCOPY    . OTHER SURGICAL HISTORY     Bladder Tack X 2  . VAGINAL HYSTERECTOMY      Review of systems negative except as noted in HPI / PMHx or noted below:  Review of Systems  Constitutional: Negative.   HENT: Negative.   Eyes: Negative.   Respiratory: Negative.   Cardiovascular: Negative.   Gastrointestinal: Negative.   Genitourinary: Negative.   Musculoskeletal: Negative.   Skin: Negative.   Neurological: Negative.   Endo/Heme/Allergies: Negative.   Psychiatric/Behavioral: Negative.      Objective:   Vitals:   10/17/19 1623  BP: 140/84  Pulse: 76  Resp: 16  SpO2: 96%          Physical Exam Constitutional:      Appearance: She is not diaphoretic.  HENT:     Head: Normocephalic.     Right Ear: Tympanic membrane, ear canal and external ear normal.     Left Ear: Tympanic membrane, ear canal and external ear normal.     Nose: Nose normal. No mucosal edema or rhinorrhea.     Mouth/Throat:     Pharynx: Uvula midline. No oropharyngeal exudate.  Eyes:     Conjunctiva/sclera: Conjunctivae normal.  Neck:     Thyroid: No thyromegaly.     Trachea: Trachea normal. No tracheal tenderness or tracheal deviation.  Cardiovascular:     Rate and Rhythm: Normal rate and  regular rhythm.     Heart sounds: Normal heart sounds, S1 normal and S2 normal. No murmur.  Pulmonary:     Effort: No respiratory distress.     Breath sounds: Normal breath sounds. No stridor. No wheezing or rales.  Lymphadenopathy:     Head:     Right side of head: No tonsillar adenopathy.     Left side of head: No tonsillar adenopathy.     Cervical: No cervical adenopathy.  Skin:    Findings: No erythema or rash.     Nails: There is no clubbing.  Neurological:     Mental Status: She is alert.     Diagnostics:    Spirometry was performed and demonstrated an FEV1 of 1.44  at 55 % of predicted.  She had a less than optimal effort on the spirometric maneuver.  Assessment and Plan:   1. Asthma, moderate persistent, well-controlled   2. Perennial allergic rhinitis   3. LPRD (laryngopharyngeal reflux disease)   4. Thrush   5. Light tobacco smoker <10 cigarettes per day   6. 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  Continue to treat and prevent inflammation:   A. Trelegy - 1 inhalation daily  B. OTC Nasacort - 1 spray each nostril daily  C. Montelukast 10 mg - 1 tablet daily  2.  Continue to treat and prevent reflux:   A.  Omeprazole to 40 mg in a.m.  B.  Famotidine 40 mg in p.m.  3.  Continue to treat and prevent thrush:   A. Nystatin oral solution - 5 mls swish and swallow after Trelegy use.   4. Continue Mixed Vespid immunotherapy   5.  If needed:   A.  Albuterol HFA - 2 inhalations or nebulization every 4-6 hours  B.  Cetirizine 10 mg - 1 tablet once a day  C.  Pataday - 1 drop each eye daily  D.  Epi-Pen  6. Use nicotine replacements to replace tobacco smoke exposure  7.  Obtain Covid vaccine when available  8.  Return to clinic in 6 months or earlier if problem.  Clarrissa appears to be doing okay as long as she continues on her medications on a regular basis.  She has had a slight flareup of her airway issue because she ran out of medications 2 weeks ago.  We  will get her to restart these medications as well as other anti-inflammatory agents for her airway and therapy directed against reflux and prevention of thrush and treatment of her hymenoptera venom hypersensitivity state with the plan noted above.  I will see her back in his clinic in 6 months or earlier if there is a problem.  Allena Katz, MD Allergy / Immunology [place]

## 2019-10-17 NOTE — Patient Instructions (Addendum)
  1.  Continue to treat and prevent inflammation:   A. Trelegy - 1 inhalation daily  B. OTC Nasacort - 1 spray each nostril daily  C. Montelukast 10 mg - 1 tablet daily  2.  Continue to treat and prevent reflux:   A.  Omeprazole to 40 mg in a.m.  B.  Famotidine 40 mg in p.m.  3.  Continue to treat and prevent thrush:   A. Nystatin oral solution - 5 mls swish and swallow after Trelegy use.   4. Continue Mixed Vespid immunotherapy   5.  If needed:   A.  Albuterol HFA - 2 inhalations or nebulization every 4-6 hours  B.  Cetirizine 10 mg - 1 tablet once a day  C.  Pataday - 1 drop each eye daily  D.  Epi-Pen  6. Use nicotine replacements to replace tobacco smoke exposure  7.  Obtain Covid vaccine when available  8.  Return to clinic in 6 months or earlier if problem.

## 2019-10-18 ENCOUNTER — Encounter: Payer: Self-pay | Admitting: Allergy and Immunology

## 2019-10-25 ENCOUNTER — Telehealth: Payer: Self-pay | Admitting: Allergy and Immunology

## 2019-10-25 ENCOUNTER — Other Ambulatory Visit: Payer: Self-pay | Admitting: *Deleted

## 2019-10-25 ENCOUNTER — Ambulatory Visit (INDEPENDENT_AMBULATORY_CARE_PROVIDER_SITE_OTHER): Payer: Medicaid Other | Admitting: *Deleted

## 2019-10-25 DIAGNOSIS — T63441D Toxic effect of venom of bees, accidental (unintentional), subsequent encounter: Secondary | ICD-10-CM | POA: Diagnosis not present

## 2019-10-25 MED ORDER — MONTELUKAST SODIUM 10 MG PO TABS: 10.0000 mg | ORAL_TABLET | Freq: Every day | ORAL | 5 refills | Status: DC

## 2019-10-25 MED ORDER — FAMOTIDINE 40 MG PO TABS: 40.0000 mg | ORAL_TABLET | Freq: Every day | ORAL | 5 refills | Status: DC

## 2019-10-25 NOTE — Telephone Encounter (Signed)
Rebecca Lee would like her prescription for Famotidine and Montelukast transferred to [facility] please.

## 2019-10-25 NOTE — Telephone Encounter (Signed)
Rx sent 

## 2019-10-31 ENCOUNTER — Ambulatory Visit (INDEPENDENT_AMBULATORY_CARE_PROVIDER_SITE_OTHER): Payer: Medicaid Other | Admitting: *Deleted

## 2019-10-31 DIAGNOSIS — Z91038 Other insect allergy status: Secondary | ICD-10-CM

## 2019-11-01 ENCOUNTER — Telehealth: Payer: Self-pay | Admitting: Allergy and Immunology

## 2019-11-01 ENCOUNTER — Other Ambulatory Visit: Payer: Self-pay

## 2019-11-01 MED ORDER — OMEPRAZOLE 40 MG PO CPDR: DELAYED_RELEASE_CAPSULE | ORAL | 5 refills | Status: DC

## 2019-11-01 MED ORDER — MONTELUKAST SODIUM 10 MG PO TABS: 10.0000 mg | ORAL_TABLET | Freq: Every day | ORAL | 5 refills | Status: AC

## 2019-11-01 MED ORDER — PAZEO 0.7 % OP SOLN: 1.0000 [drp] | Freq: Every day | OPHTHALMIC | 2 refills | Status: AC | PRN

## 2019-11-01 MED ORDER — NYSTATIN 100000 UNIT/ML MT SUSP: OROMUCOSAL | 5 refills | Status: AC

## 2019-11-01 MED ORDER — FAMOTIDINE 40 MG PO TABS: 40.0000 mg | ORAL_TABLET | Freq: Every day | ORAL | 5 refills | Status: AC

## 2019-11-01 MED ORDER — CHANTIX STARTING MONTH PAK 0.5 MG X 11 & 1 MG X 42 PO TABS: ORAL_TABLET | ORAL | 0 refills | Status: DC

## 2019-11-01 MED ORDER — TRELEGY ELLIPTA 100-62.5-25 MCG/INH IN AEPB: 1.0000 | INHALATION_SPRAY | Freq: Every day | RESPIRATORY_TRACT | 5 refills | Status: AC

## 2019-11-01 NOTE — Telephone Encounter (Signed)
Prescriptions sent to [facility].

## 2019-11-01 NOTE — Telephone Encounter (Signed)
Rebecca Lee came into the office and wanted to know if Dr. Lucie Leather could prescribe her something to quit smoking.  She requested Chantix. Please advise.

## 2019-11-01 NOTE — Telephone Encounter (Signed)
Chantix starter pack sent to [facility].

## 2019-11-01 NOTE — Telephone Encounter (Signed)
Please prescribe patient Chantix starter pack.  She will need to check with her pharmacy regarding drug interactions with her other medications.  Chantix works best when using a nicotine substitute for tobacco smoke exposure.

## 2019-11-01 NOTE — Telephone Encounter (Signed)
[facility] called and states Rebecca Lee is transferring all of her medications over to them.  They cannot transfer the prescriptions from Dr. Lucie Leather because they do not have refills.  Rebecca Lee called back and would like all of her Dr. Lucie Leather prescribed medications sent to [facility] please.

## 2019-11-02 ENCOUNTER — Telehealth: Payer: Self-pay

## 2019-11-02 NOTE — Telephone Encounter (Signed)
After submitting several PA attempts, Medicaid has stated Rebecca Lee has to try and fail 2 medications of the same type as Trelegy. The 3 covered medications are: Dulera, Symbicort and Advair Diskus.   Please advise.

## 2019-11-03 NOTE — Telephone Encounter (Signed)
PA approved and faxed to the pharmacy. 

## 2019-11-03 NOTE — Telephone Encounter (Signed)
I have spoken with Rebecca Lee. She has tried [place], Spiriva, Advair, and Symbicort. I have re-submitted a PA and we will wait for approval or denial.

## 2019-11-03 NOTE — Telephone Encounter (Signed)
These inform patient that she has to fail several medications as dictated by insurance company before she can receive trilogy.  How she failed Dulera, Symbicort, or others noted on the insurance list so far?  If not, have her use Symbicort 160 - 2 inhalations twice a day

## 2019-11-07 ENCOUNTER — Ambulatory Visit (INDEPENDENT_AMBULATORY_CARE_PROVIDER_SITE_OTHER): Payer: Medicaid Other | Admitting: *Deleted

## 2019-11-07 DIAGNOSIS — Z91038 Other insect allergy status: Secondary | ICD-10-CM | POA: Diagnosis not present

## 2019-11-22 ENCOUNTER — Other Ambulatory Visit: Payer: Self-pay | Admitting: Allergy and Immunology

## 2019-11-22 ENCOUNTER — Telehealth: Payer: Self-pay | Admitting: Allergy and Immunology

## 2019-11-22 ENCOUNTER — Other Ambulatory Visit: Payer: Self-pay | Admitting: *Deleted

## 2019-11-22 MED ORDER — VARENICLINE TARTRATE 1 MG PO TABS: 1.0000 mg | ORAL_TABLET | Freq: Two times a day (BID) | ORAL | 0 refills | Status: AC

## 2019-11-22 NOTE — Telephone Encounter (Signed)
One refill sent to [facility].

## 2019-11-22 NOTE — Telephone Encounter (Signed)
This medication was filled on 10/31/2018. Patient is requesting refill on Chantix. Last OV stated to continue Nicotine replacement. Please advise on continuation of medication.

## 2019-11-22 NOTE — Telephone Encounter (Signed)
Pharmacy called and states Rebecca Lee should have had a continued dose of CHANTIX sent it.  Instead another starter dose was called in.  Can a continued dose of Chantix be sent in to [facility] in [place].

## 2019-11-28 ENCOUNTER — Ambulatory Visit (INDEPENDENT_AMBULATORY_CARE_PROVIDER_SITE_OTHER): Payer: Medicaid Other | Admitting: *Deleted

## 2019-11-28 DIAGNOSIS — Z91038 Other insect allergy status: Secondary | ICD-10-CM

## 2019-12-12 ENCOUNTER — Other Ambulatory Visit: Payer: Self-pay

## 2019-12-12 ENCOUNTER — Ambulatory Visit (INDEPENDENT_AMBULATORY_CARE_PROVIDER_SITE_OTHER): Payer: Medicaid Other | Admitting: Allergy and Immunology

## 2019-12-12 ENCOUNTER — Encounter: Payer: Self-pay | Admitting: Allergy and Immunology

## 2019-12-12 VITALS — BP 124/74 | HR 64 | Temp 97.7°F | Resp 18

## 2019-12-12 DIAGNOSIS — J3089 Other allergic rhinitis: Secondary | ICD-10-CM | POA: Diagnosis not present

## 2019-12-12 DIAGNOSIS — M546 Pain in thoracic spine: Secondary | ICD-10-CM

## 2019-12-12 DIAGNOSIS — J4540 Moderate persistent asthma, uncomplicated: Secondary | ICD-10-CM

## 2019-12-12 DIAGNOSIS — Z91038 Other insect allergy status: Secondary | ICD-10-CM | POA: Diagnosis not present

## 2019-12-12 DIAGNOSIS — B370 Candidal stomatitis: Secondary | ICD-10-CM

## 2019-12-12 DIAGNOSIS — K219 Gastro-esophageal reflux disease without esophagitis: Secondary | ICD-10-CM

## 2019-12-12 DIAGNOSIS — D849 Immunodeficiency, unspecified: Secondary | ICD-10-CM

## 2019-12-12 NOTE — Progress Notes (Signed)
[facility]   Follow-up Note   Referring Provider: Ronita Hipps, MD Primary Provider: Ronita Hipps, MD Date of Office Visit: 12/12/2019  Subjective:   Rebecca Lee (DOB: 1961/02/04) is a 59 y.o. female who returns to the [facility] on 12/12/2019 in re-evaluation of the following:  HPI: Jerusha presents to this clinic in reevaluation of asthma and allergic rhinoconjunctivitis and LPR and hymenoptera venom hypersensitivity state as well as a history of recurrent emesis and tobacco use.  Her last visit to this clinic was 17 October 2019.  She has really done well with her airway and rarely uses a short acting bronchodilator and can exert herself without any problem and has not required a systemic steroid or an antibiotic since her last visit in this clinic. She continues to consistently use her combination inhaler and a nasal steroid and montelukast.  Likewise her throat and her reflux have been under excellent control while using omeprazole and famotidine on a regular basis.  She has not been having any problems with thrush for she now consistently uses nystatin oral solution after using her combination inhaler every day.  She is now on Chantix for her smoking hobby.  Her last use of a cigarette was 3 weeks ago.  She developed acute back pain today.  She went out of her house and all of a sudden she had a stabbing pain affecting the middle of her mid back.  This is not pleuritic in quality and does not appear to be rotational.  She did not have any trauma to her back and she did not perform any unusual exercise this weekend.  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  She continues to use Enbrel for her rheumatoid arthritis.  Allergies as of 12/12/2019      Reactions   Sumatriptan Hives   Nitrofurantoin Hives      Medication List    amitriptyline 50  MG tablet Commonly known as: ELAVIL Take one to two tablets at bedtime   benztropine 0.5 MG tablet Commonly known as: COGENTIN TAKE 1 TABLET BY MOUTH TWICE A DAY AS NEEDED FOR ANXIETY, RESTLESNESS, OR MOVEMENT   CALCIUM CITRATE + D3 PO Take 2 tablets by mouth 2 (two) times a day.   cetirizine 10 MG tablet Commonly known as: ZYRTEC Take 10 mg by mouth daily.   ENBREL [place] Inject into the skin once a week.   EpiPen 2-Pak 0.3 mg/0.3 mL [place] injection Generic drug: EPINEPHrine Inject 0.3 mg into the muscle as needed for anaphylaxis.   escitalopram 10 MG tablet Commonly known as: LEXAPRO   famotidine 40 MG tablet Commonly known as: PEPCID Take 1 tablet (40 mg total) by mouth at bedtime.   hydrochlorothiazide 12.5 MG capsule Commonly known as: MICROZIDE   hydroxychloroquine 200 MG tablet Commonly known as: PLAQUENIL Take 200 mg by mouth daily.   KLONOPIN PO Take by mouth as needed.   lamoTRIgine 100 MG tablet Commonly known as: LAMICTAL Take one and one-half tablets twice daily.   Latuda 80 MG Tabs tablet Generic drug: lurasidone TAKE 1 TABLET BY MOUTH EVERY DAY IN THE MORNING FPR MOOD STABILIZATION   levothyroxine 25 MCG tablet Commonly known as: SYNTHROID Take 25 mcg by mouth daily.   LUNESTA PO Take by mouth at bedtime.   meloxicam 15 MG tablet Commonly known as: MOBIC   montelukast 10 MG tablet Commonly known as:  SINGULAIR Take 1 tablet (10 mg total) by mouth at bedtime.   multivitamin tablet Take 1 tablet by mouth daily.   nystatin 100000 UNIT/ML suspension Commonly known as: MYCOSTATIN Swish and swallow 71ml by mouth once daily after Trelegy use   omeprazole 40 MG capsule Commonly known as: PRILOSEC Take one capsule every morning   Pazeo 0.7 % Soln Generic drug: Olopatadine HCl Place 1 drop into both eyes daily as needed.   prazosin 1 MG capsule Commonly known as: MINIPRESS Take 2 mg by mouth at bedtime.   propranolol 20 MG tablet Commonly  known as: INDERAL Take 20 mg by mouth 2 (two) times daily.   Trelegy Ellipta 100-62.5-25 MCG/INH Aepb Generic drug: Fluticasone-Umeclidin-Vilant Inhale 1 puff into the lungs daily. Rinse, gargle and spit after use.   varenicline 1 MG tablet Commonly known as: Chantix Continuing Month Pak Take 1 tablet (1 mg total) by mouth 2 (two) times daily.   Ventolin HFA 108 (90 Base) MCG/ACT inhaler Generic drug: albuterol Inhale 2 puffs into the lungs every 4 (four) hours as needed for wheezing or shortness of breath.   albuterol (2.5 MG/3ML) 0.083% nebulizer solution Commonly known as: PROVENTIL Take 2.5 mg by nebulization every 6 (six) hours as needed for wheezing or shortness of breath.   VITAMIN B-12 PO Take by mouth.       Past Medical History:  Diagnosis Date  . Aneurysm (HCC)   . Asthma   . COPD (chronic obstructive pulmonary disease) (HCC)   . Hypothyroidism   . Rheumatoid arthritis (HCC)   . Tobacco abuse     Past Surgical History:  Procedure Laterality Date  . CARPAL TUNNEL RELEASE Bilateral   . CEREBRAL ANEURYSM REPAIR      two clips  . GALLBLADDER SURGERY    . KNEE ARTHROSCOPY    . OTHER SURGICAL HISTORY     Bladder Tack X 2  . VAGINAL HYSTERECTOMY      Review of systems negative except as noted in HPI / PMHx or noted below:  Review of Systems  Constitutional: Negative.   HENT: Negative.   Eyes: Negative.   Respiratory: Negative.   Cardiovascular: Negative.   Gastrointestinal: Negative.   Genitourinary: Negative.   Musculoskeletal: Negative.   Skin: Negative.   Neurological: Negative.   Endo/Heme/Allergies: Negative.   Psychiatric/Behavioral: Negative.      Objective:   Vitals:   12/12/19 1617  BP: 124/74  Pulse: 64  Resp: 18  Temp: 97.7 F (36.5 C)  SpO2: 98%          Physical Exam Constitutional:      Appearance: She is not diaphoretic.  HENT:     Head: Normocephalic.     Right Ear: Tympanic membrane, ear canal and external ear  normal.     Left Ear: Tympanic membrane, ear canal and external ear normal.     Nose: Nose normal. No mucosal edema or rhinorrhea.     Mouth/Throat:     Pharynx: Uvula midline. No oropharyngeal exudate.  Eyes:     Conjunctiva/sclera: Conjunctivae normal.  Neck:     Thyroid: No thyromegaly.     Trachea: Trachea normal. No tracheal tenderness or tracheal deviation.  Cardiovascular:     Rate and Rhythm: Normal rate and regular rhythm.     Heart sounds: Normal heart sounds, S1 normal and S2 normal. No murmur.  Pulmonary:     Effort: No respiratory distress.     Breath sounds: Normal breath sounds. No stridor.  No wheezing or rales.  Musculoskeletal:     Comments: No palpable tenderness of the back.  Lymphadenopathy:     Head:     Right side of head: No tonsillar adenopathy.     Left side of head: No tonsillar adenopathy.     Cervical: No cervical adenopathy.  Skin:    Findings: No erythema or rash.     Nails: There is no clubbing.  Neurological:     Mental Status: She is alert.     Diagnostics:    Spirometry was not performed adequately secondary to her back pain.   Assessment and Plan:   1. Asthma, moderate persistent, well-controlled   2. Perennial allergic rhinitis   3. 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 LPRD (laryngopharyngeal reflux disease)   5. Thrush   6. Immunosuppression (HCC)   7. Acute midline thoracic back pain     1.  Continue to treat and prevent inflammation:   A. Trelegy - 1 inhalation daily  B. OTC Nasacort - 1 spray each nostril daily  C. Montelukast 10 mg - 1 tablet daily  2.  Continue to treat and prevent reflux:   A.  Omeprazole to 40 mg in a.m.  B.  Famotidine 40 mg in p.m.  3.  Continue to treat and prevent thrush:   A. Nystatin oral solution - 5 mls swish and swallow after Trelegy use.   4. Continue Mixed Vespid immunotherapy   5.  If needed:   A.  Albuterol HFA - 2 inhalations or nebulization every 4-6 hours  B.  Cetirizine 10 mg -  1 tablet once a day  C.  Pataday - 1 drop each eye daily  D.  Epi-Pen  E. Tylenol / acetaminophen  6.  May need back X-ray if pain does not resolve  7.  Obtain Covid vaccine when available  8.  Return to clinic in 6 months or earlier if problem.  Shadaya is doing quite well with her airway issue while using medical therapy directed against respiratory tract inflammation and reflux.  And her immunotherapy for hymenoptera venom allergy is going well.  The etiology of her back pain is unknown.  Because this is relatively acute we will just approach this conservatively and not exposure her to radiation at this point unless of course this issue does not resolve.  She can use acetaminophen for pain relief.  Her embrel use always raises the possibility of a significant infection that can develop sometimes in the thoracic vertebrae but we are once again not going to have her undergo any further evaluation at this point assuming that is going to resolve in the next 10 to 14 days.  Laurette Schimke, MD Allergy / Immunology [facility]

## 2019-12-12 NOTE — Patient Instructions (Addendum)
  1.  Continue to treat and prevent inflammation:   A. Trelegy - 1 inhalation daily  B. OTC Nasacort - 1 spray each nostril daily  C. Montelukast 10 mg - 1 tablet daily  2.  Continue to treat and prevent reflux:   A.  Omeprazole to 40 mg in a.m.  B.  Famotidine 40 mg in p.m.  3.  Continue to treat and prevent thrush:   A. Nystatin oral solution - 5 mls swish and swallow after Trelegy use.   4. Continue Mixed Vespid immunotherapy   5.  If needed:   A.  Albuterol HFA - 2 inhalations or nebulization every 4-6 hours  B.  Cetirizine 10 mg - 1 tablet once a day  C.  Pataday - 1 drop each eye daily  D.  Epi-Pen  E. Tylenol / acetaminophen  6.  May need back X-ray if pain does not resolve  7.  Obtain Covid vaccine when available  8.  Return to clinic in 6 months or earlier if problem.

## 2019-12-13 ENCOUNTER — Encounter: Payer: Self-pay | Admitting: Allergy and Immunology

## 2019-12-15 ENCOUNTER — Other Ambulatory Visit: Payer: Self-pay | Admitting: Allergy and Immunology

## 2019-12-16 ENCOUNTER — Other Ambulatory Visit: Payer: Self-pay | Admitting: Allergy and Immunology

## 2020-02-15 ENCOUNTER — Other Ambulatory Visit: Payer: Self-pay | Admitting: Allergy and Immunology

## 2020-03-19 ENCOUNTER — Other Ambulatory Visit: Payer: Self-pay | Admitting: Allergy and Immunology

## 2020-06-16 ENCOUNTER — Other Ambulatory Visit: Payer: Self-pay | Admitting: Allergy and Immunology

## 2020-07-31 ENCOUNTER — Other Ambulatory Visit: Payer: Self-pay | Admitting: Allergy and Immunology

## 2020-11-06 ENCOUNTER — Other Ambulatory Visit: Payer: Self-pay | Admitting: Allergy and Immunology

## 2020-11-12 ENCOUNTER — Other Ambulatory Visit: Payer: Self-pay | Admitting: Allergy and Immunology
